# Patient Record
Sex: Male | Born: 1990 | Race: Black or African American | Hispanic: No | Marital: Single | Smoking: Former smoker
Health system: Southern US, Community
[De-identification: ages and names within clinical notes are randomized; demographics above are authoritative.]

## PROBLEM LIST (undated history)

## (undated) DIAGNOSIS — A549 Gonococcal infection, unspecified: Secondary | ICD-10-CM

## (undated) DIAGNOSIS — J45909 Unspecified asthma, uncomplicated: Secondary | ICD-10-CM

## (undated) HISTORY — DX: Gonococcal infection, unspecified: A54.9

---
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
## 2010-12-10 LAB — POCT INFECTIOUS MONO SCREEN: Mono Screen: POSITIVE — AB

## 2011-08-15 ENCOUNTER — Ambulatory Visit (INDEPENDENT_AMBULATORY_CARE_PROVIDER_SITE_OTHER): Payer: Self-pay

## 2011-08-15 DIAGNOSIS — B20 Human immunodeficiency virus [HIV] disease: Secondary | ICD-10-CM

## 2011-08-15 DIAGNOSIS — Z23 Encounter for immunization: Secondary | ICD-10-CM

## 2011-08-15 LAB — URINALYSIS
Bilirubin Urine: NEGATIVE
Hgb urine dipstick: NEGATIVE
Ketones, ur: NEGATIVE mg/dL
Nitrite: NEGATIVE
Protein, ur: NEGATIVE mg/dL
Specific Gravity, Urine: 1.027 (ref 1.005–1.030)
Urobilinogen, UA: 1 mg/dL (ref 0.0–1.0)

## 2011-08-15 LAB — CBC WITH DIFFERENTIAL/PLATELET
Basophils Absolute: 0 10*3/uL (ref 0.0–0.1)
Basophils Relative: 0 % (ref 0–1)
Eosinophils Relative: 2 % (ref 0–5)
Lymphocytes Relative: 46 % (ref 12–46)
MCHC: 33.3 g/dL (ref 30.0–36.0)
MCV: 94.1 fL (ref 78.0–100.0)
Neutro Abs: 1.5 10*3/uL — ABNORMAL LOW (ref 1.7–7.7)
Platelets: 222 10*3/uL (ref 150–400)
RDW: 13.4 % (ref 11.5–15.5)
WBC: 3.8 10*3/uL — ABNORMAL LOW (ref 4.0–10.5)

## 2011-08-15 LAB — LIPID PANEL
LDL Cholesterol: 109 mg/dL — ABNORMAL HIGH (ref 0–99)
VLDL: 10 mg/dL (ref 0–40)

## 2011-08-15 LAB — HEPATITIS B CORE ANTIBODY, TOTAL: Hep B Core Total Ab: NEGATIVE

## 2011-08-15 LAB — RPR

## 2011-08-15 LAB — HEPATITIS A ANTIBODY, TOTAL: Hep A Total Ab: NEGATIVE

## 2011-08-15 LAB — COMPLETE METABOLIC PANEL WITH GFR
ALT: 9 U/L (ref 0–53)
AST: 21 U/L (ref 0–37)
Alkaline Phosphatase: 45 U/L (ref 39–117)
Calcium: 9.7 mg/dL (ref 8.4–10.5)
Chloride: 101 mEq/L (ref 96–112)
Creat: 0.94 mg/dL (ref 0.50–1.35)

## 2011-08-15 LAB — HEPATITIS C ANTIBODY: HCV Ab: NEGATIVE

## 2011-08-15 LAB — HEPATITIS B SURFACE ANTIGEN: Hepatitis B Surface Ag: NEGATIVE

## 2011-08-15 LAB — HEPATITIS B SURFACE ANTIBODY,QUALITATIVE: Hep B S Ab: NEGATIVE

## 2011-08-17 LAB — TB SKIN TEST
Induration: 0
TB Skin Test: NEGATIVE mm

## 2011-08-19 LAB — HIV-1 RNA ULTRAQUANT REFLEX TO GENTYP+: HIV 1 RNA Quant: 361 copies/mL — ABNORMAL HIGH (ref <20)

## 2011-08-20 DIAGNOSIS — B20 Human immunodeficiency virus [HIV] disease: Secondary | ICD-10-CM | POA: Insufficient documentation

## 2011-08-20 LAB — HIV 1/2 CONFIRMATION
HIV-1 antibody: POSITIVE — AB
HIV-2 Ab: NEGATIVE

## 2011-08-20 NOTE — Progress Notes (Signed)
Patient states he is still in shock from his diagnosis and has not really dealt with the reality of having HIV.   He has tried to avoid coming for treatment due to embarrassment.  I advised him this is normal at this current stage but if it continues he should inform his provider.  Counseling services were offered but he refused.  Pt had very flat affect during the intake but did smile before leaving.

## 2011-09-11 ENCOUNTER — Encounter: Payer: Self-pay | Admitting: *Deleted

## 2011-09-12 ENCOUNTER — Telehealth: Payer: Self-pay | Admitting: *Deleted

## 2011-09-12 ENCOUNTER — Ambulatory Visit: Payer: Self-pay | Admitting: Internal Medicine

## 2011-09-12 NOTE — Telephone Encounter (Signed)
Message left for pt to call for another appt with Dr. Drue Second.

## 2011-09-26 ENCOUNTER — Encounter: Payer: Self-pay | Admitting: Internal Medicine

## 2011-09-26 ENCOUNTER — Ambulatory Visit (INDEPENDENT_AMBULATORY_CARE_PROVIDER_SITE_OTHER): Payer: Self-pay | Admitting: Internal Medicine

## 2011-09-26 VITALS — BP 110/74 | HR 89 | Temp 98.3°F | Ht 73.0 in | Wt 158.0 lb

## 2011-09-26 DIAGNOSIS — A5400 Gonococcal infection of lower genitourinary tract, unspecified: Secondary | ICD-10-CM

## 2011-09-26 DIAGNOSIS — Z21 Asymptomatic human immunodeficiency virus [HIV] infection status: Secondary | ICD-10-CM

## 2011-09-26 DIAGNOSIS — Z23 Encounter for immunization: Secondary | ICD-10-CM

## 2011-09-26 DIAGNOSIS — B20 Human immunodeficiency virus [HIV] disease: Secondary | ICD-10-CM

## 2011-09-26 DIAGNOSIS — A549 Gonococcal infection, unspecified: Secondary | ICD-10-CM

## 2011-09-26 NOTE — Progress Notes (Signed)
HIV CLINIC VISIT  RFV: new patient, establishing care after recent diagnosis Subjective:    Patient ID: Timothy Reynolds, male    DOB: May 06, 1991, 21 y.o.   MRN: [id number]  HPI Timothy Reynolds is a 20yo AAM with HIV disease, diagnosed in Aug 2012, in setting of STI workup. The patient RF for HIV is unprotected sex with HIV infected persons/MSM. He denies any IVDU. He has been still processing his diagnosis over the last few months. He has been reading some information regarding hiv diagnosis, and treatment. He presents to clinic to gain more information. Still not ready to start therapy at this point. He has only disclosed his HIV status to a friend, presumably his roommate. He is currently not sexually active.  During this visit we have discussed hiv transmission, rec for starting treatment, what that would entail, applying for adap to get insurance.  He is currently uninsured but does work 2 part time jobs, working 50-60s hrs per week.  Active Ambulatory Problems    Diagnosis Date Noted  . HIV infection 08/20/2011   Resolved Ambulatory Problems    Diagnosis Date Noted  . No Resolved Ambulatory Problems   No Additional Past Medical History  - GC in 04/2011  No Known Allergies Prior to Admission medications   Not on File  - multivitamin  SH: he moved to the area roughly 2 years ago from [place] area. His mother is in the [place], and has 3 sisters that live in the [place], in other states. He does not smoke and occasionally drinks alcohol socially. No illicit drug use  FH: CAD, malignancies with his maternal grandmother  Review of Systems  Constitutional: Negative for fever, chills, diaphoresis, activity change, appetite change, fatigue and unexpected weight change.  HENT: Negative for congestion, sore throat, rhinorrhea, sneezing, trouble swallowing and sinus pressure.  Eyes: Negative for photophobia and visual disturbance.  Respiratory: Negative for cough, chest tightness, shortness of breath,  wheezing and stridor.  Cardiovascular: Negative for chest pain, palpitations and leg swelling.  Gastrointestinal: Negative for nausea, vomiting, abdominal pain, diarrhea, constipation, blood in stool, abdominal distention and anal bleeding.  Genitourinary: Negative for dysuria, hematuria, flank pain and difficulty urinating.  Musculoskeletal: occassional myalgias from work equipment backpack he carries, but not presentally having any problems, no back pain, joint swelling, arthralgias and gait problem.  Skin: Negative for color change, pallor, rash and wound.  Neurological: Negative for dizziness, tremors, weakness and light-headedness.  Hematological: Negative for adenopathy. Does not bruise/bleed easily.  Psychiatric/Behavioral: Negative for behavioral problems, confusion, sleep disturbance, dysphoric mood, decreased concentration and agitation.       Objective:   Physical Exam BP 110/74  Pulse 89  Temp(Src) 98.3 F (36.8 C) (Oral)  Ht 6\' 1"  (1.854 m)  Wt 158 lb (71.668 kg)  BMI 20.85 kg/m2  Constitutional: He is oriented to person, place, and time. He appears well-developed and well-nourished. No distress.  HENT:  Mouth/Throat: Oropharynx is clear and moist. No oropharyngeal exudate.  Cardiovascular: Normal rate, regular rhythm and normal heart sounds. Exam reveals no gallop and no friction rub.  No murmur heard.  Pulmonary/Chest: Effort normal and breath sounds normal. No respiratory distress. He has no wheezes.  Abdominal: Soft. Bowel sounds are normal. He exhibits no distension. There is no tenderness.  Lymphadenopathy:  He has no cervical adenopathy.  Neurological: He is alert and oriented to person, place, and time.  Skin: Skin is warm and dry. No rash noted. No erythema.  Psychiatric: He has a normal  mood and affect. His behavior is normal.      Assessment & Plan:  21 yo Male with HIV disease, CD 4 count of 600(37%)/VL 361, ART naive  1) HIV = will continue to monitor  CD4 count and VL on a quarterly basis. At this point patient does not need to start ART based upon his CD 4 count. He still is processing his HIV diagnosis. Have offered counselors to help discuss disclosure to his family and friends.  2) access to care =will have patient apply to adap so that he will not have to pay for visits and lab works that pertain to HIV. Have asked the patient to make appt with Britta Mccreedy, and bring in his lab bill that he has already received from his prior visit.  3) health maintenance = patient received H B series as an adoloescent, but does not appear to be immune. Have given him a booster of 2 doses of HBV, will check at his next visit if HBsAb response.  4) hiv prevention = will need to use condoms to prevent transmission onto others  rtc in 3 months.

## 2011-10-09 ENCOUNTER — Ambulatory Visit: Payer: Self-pay

## 2011-10-17 ENCOUNTER — Ambulatory Visit: Payer: Self-pay

## 2011-11-18 ENCOUNTER — Other Ambulatory Visit: Payer: Self-pay | Admitting: Licensed Clinical Social Worker

## 2011-11-18 DIAGNOSIS — B20 Human immunodeficiency virus [HIV] disease: Secondary | ICD-10-CM

## 2011-12-12 ENCOUNTER — Other Ambulatory Visit: Payer: Self-pay

## 2011-12-26 ENCOUNTER — Ambulatory Visit: Payer: Self-pay | Admitting: Internal Medicine

## 2012-04-28 ENCOUNTER — Other Ambulatory Visit: Payer: Self-pay

## 2012-05-12 ENCOUNTER — Ambulatory Visit: Payer: Self-pay | Admitting: Internal Medicine

## 2012-06-10 ENCOUNTER — Ambulatory Visit: Payer: Self-pay | Admitting: Infectious Disease

## 2012-06-10 ENCOUNTER — Ambulatory Visit (INDEPENDENT_AMBULATORY_CARE_PROVIDER_SITE_OTHER): Payer: Self-pay | Admitting: Infectious Disease

## 2012-06-10 VITALS — BP 143/89 | HR 86 | Temp 97.8°F | Ht 72.0 in | Wt 170.0 lb

## 2012-06-10 DIAGNOSIS — R369 Urethral discharge, unspecified: Secondary | ICD-10-CM

## 2012-06-10 DIAGNOSIS — A5400 Gonococcal infection of lower genitourinary tract, unspecified: Secondary | ICD-10-CM

## 2012-06-10 DIAGNOSIS — A549 Gonococcal infection, unspecified: Secondary | ICD-10-CM

## 2012-06-10 DIAGNOSIS — Z23 Encounter for immunization: Secondary | ICD-10-CM

## 2012-06-10 DIAGNOSIS — B20 Human immunodeficiency virus [HIV] disease: Secondary | ICD-10-CM

## 2012-06-10 LAB — CBC WITH DIFFERENTIAL/PLATELET
Basophils Absolute: 0 10*3/uL (ref 0.0–0.1)
Basophils Relative: 0 % (ref 0–1)
Eosinophils Absolute: 0.1 10*3/uL (ref 0.0–0.7)
Eosinophils Relative: 1 % (ref 0–5)
MCH: 31.4 pg (ref 26.0–34.0)
MCV: 93.2 fL (ref 78.0–100.0)
Neutrophils Relative %: 70 % (ref 43–77)
Platelets: 258 10*3/uL (ref 150–400)
RBC: 4.72 MIL/uL (ref 4.22–5.81)
RDW: 12.8 % (ref 11.5–15.5)

## 2012-06-10 LAB — COMPLETE METABOLIC PANEL WITH GFR
ALT: 12 U/L (ref 0–53)
AST: 19 U/L (ref 0–37)
Albumin: 4.6 g/dL (ref 3.5–5.2)
BUN: 10 mg/dL (ref 6–23)
CO2: 29 mEq/L (ref 19–32)
Calcium: 9.6 mg/dL (ref 8.4–10.5)
Chloride: 102 mEq/L (ref 96–112)
Creat: 0.93 mg/dL (ref 0.50–1.35)
GFR, Est African American: >89 mL/min
Potassium: 3.6 mEq/L (ref 3.5–5.3)

## 2012-06-10 LAB — LIPID PANEL
Cholesterol: 189 mg/dL (ref 0–200)
LDL Cholesterol: 119 mg/dL — ABNORMAL HIGH (ref 0–99)
Triglycerides: 41 mg/dL (ref <150)
VLDL: 8 mg/dL (ref 0–40)

## 2012-06-10 MED ORDER — CEFTRIAXONE SODIUM 1 G IJ SOLR
250.0000 mg | Freq: Once | INTRAMUSCULAR | Status: AC
  Administered 2012-06-10: 250 mg via INTRAMUSCULAR

## 2012-06-10 MED ORDER — AZITHROMYCIN 250 MG PO TABS
1000.0000 mg | ORAL_TABLET | Freq: Once | ORAL | Status: AC
  Administered 2012-06-10: 1000 mg via ORAL

## 2012-06-10 NOTE — Progress Notes (Signed)
Subjective:    Patient ID: Timothy Reynolds, male    DOB: March 17, 1991, 21 y.o.   MRN: [id number]  HPI  21 year old Afro-American male diagnosed with HIV when being worked up for sexual transmitted diseases namely gonorrheal infection. As far load actually was in a few 100 copies per milliliter when checked in December of 2012. Since then he has not been seen in our clinic as was planned. He is not on antiretroviral medicines. He is currently unemployed he brought in paperwork to work for [employer] but is not yet enrolled in the AIDS drug assistance program. He presents to clinic acutely today with complaints of purulent discharge from his penis. He admits to unprotected sexual intercourse with a new partner since been diagnosed with HIV largely insert of intercourse.  Review of Systems  Constitutional: Negative for fever, chills, diaphoresis, activity change, appetite change, fatigue and unexpected weight change.  HENT: Negative for congestion, sore throat, rhinorrhea, sneezing, trouble swallowing and sinus pressure.   Eyes: Negative for photophobia and visual disturbance.  Respiratory: Negative for cough, chest tightness, shortness of breath, wheezing and stridor.   Cardiovascular: Negative for chest pain, palpitations and leg swelling.  Gastrointestinal: Negative for nausea, vomiting, abdominal pain, diarrhea, constipation, blood in stool, abdominal distention and anal bleeding.  Genitourinary: Negative for dysuria, hematuria, flank pain and difficulty urinating.  Musculoskeletal: Negative for myalgias, back pain, joint swelling, arthralgias and gait problem.  Skin: Negative for color change, pallor, rash and wound.  Neurological: Negative for dizziness, tremors, weakness and light-headedness.  Hematological: Negative for adenopathy. Does not bruise/bleed easily.  Psychiatric/Behavioral: Negative for behavioral problems, confusion, disturbed wake/sleep cycle, dysphoric mood, decreased  concentration and agitation.       Objective:   Physical Exam  Constitutional: He is oriented to person, place, and time. He appears well-developed and well-nourished. No distress.  HENT:  Head: Normocephalic and atraumatic.  Mouth/Throat: Oropharynx is clear and moist. No oropharyngeal exudate.  Eyes: Conjunctivae normal and EOM are normal. Pupils are equal, round, and reactive to light. No scleral icterus.  Neck: Normal range of motion. Neck supple. No JVD present.  Cardiovascular: Normal rate, regular rhythm and normal heart sounds.  Exam reveals no gallop and no friction rub.   No murmur heard. Pulmonary/Chest: Effort normal and breath sounds normal. No respiratory distress. He has no wheezes. He has no rales. He exhibits no tenderness.  Abdominal: He exhibits no distension and no mass. There is no tenderness. There is no rebound and no guarding.  Genitourinary:    Penile tenderness present. Discharge found.  Musculoskeletal: He exhibits no edema and no tenderness.  Lymphadenopathy:    He has no cervical adenopathy.  Neurological: He is alert and oriented to person, place, and time. He has normal reflexes. He exhibits normal muscle tone. Coordination normal.  Skin: Skin is warm and dry. He is not diaphoretic. No erythema. No pallor.  Psychiatric: He has a normal mood and affect. His behavior is normal. Judgment and thought content normal.          Assessment & Plan:  Penile discharge Undoubtedly gonorrhea again. We're checking it GC and Chlamydia and urine as he did not want to have the urethral probe performed. He was given Rocephin and azithromycin in clinic. Have asked him to please use condoms in the future and make sure that all partners of been tested and treated for both gonorrhea Chlamydia and HIV of course. Over a syphilis test as well in am.  HIV infection Recheck  viral load and CD4 count. I expect his viral load be high now Will genotype it and come up with an  antiviral regimen for him.

## 2012-06-10 NOTE — Assessment & Plan Note (Signed)
Undoubtedly gonorrhea again. We're checking it GC and Chlamydia and urine as he did not want to have the urethral probe performed. He was given Rocephin and azithromycin in clinic. Have asked him to please use condoms in the future and make sure that all partners of been tested and treated for both gonorrhea Chlamydia and HIV of course. Over a syphilis test as well in am.

## 2012-06-10 NOTE — Assessment & Plan Note (Signed)
Recheck viral load and CD4 count. I expect his viral load be high now Will genotype it and come up with an antiviral regimen for him.

## 2012-06-10 NOTE — Patient Instructions (Signed)
PLEASE COME BACK TO CLINIC IN 3 WEEKS TIME TO DISCUSS A REGIMEN  PLEASE USE CONDOMS AND MAKE SURE YOUR PARTNERS ARE TESTED AND TREATED

## 2012-06-11 LAB — T-HELPER CELL (CD4) - (RCID CLINIC ONLY): CD4 T Cell Abs: 330 uL — ABNORMAL LOW (ref 400–2700)

## 2012-06-11 LAB — HIV-1 RNA ULTRAQUANT REFLEX TO GENTYP+: HIV 1 RNA Quant: 705 copies/mL — ABNORMAL HIGH (ref <20)

## 2012-07-01 ENCOUNTER — Ambulatory Visit: Payer: Self-pay

## 2012-07-01 ENCOUNTER — Ambulatory Visit (INDEPENDENT_AMBULATORY_CARE_PROVIDER_SITE_OTHER): Payer: Self-pay | Admitting: Infectious Disease

## 2012-07-01 ENCOUNTER — Encounter: Payer: Self-pay | Admitting: Infectious Disease

## 2012-07-01 VITALS — BP 145/76 | HR 93 | Temp 98.2°F | Ht 72.0 in | Wt 170.0 lb

## 2012-07-01 DIAGNOSIS — B20 Human immunodeficiency virus [HIV] disease: Secondary | ICD-10-CM

## 2012-07-01 DIAGNOSIS — A549 Gonococcal infection, unspecified: Secondary | ICD-10-CM

## 2012-07-01 DIAGNOSIS — A5400 Gonococcal infection of lower genitourinary tract, unspecified: Secondary | ICD-10-CM

## 2012-07-01 MED ORDER — ELVITEG-COBIC-EMTRICIT-TENOFDF 150-150-200-300 MG PO TABS: 1.0000 | ORAL_TABLET | Freq: Every day | ORAL | Status: DC

## 2012-07-01 NOTE — Progress Notes (Signed)
Subjective:    Patient ID: Timothy Reynolds, male    DOB: 1990-10-05, 21 y.o.   MRN: [id number]  HPI  21 year old [place] American male with HIV diagnosed in August of 2012, never on antiviral therapy with low-level viremia with a few 100 copies last December and now 700 copies per milliliters blood. CD4 count has ranged in the 600s to mid 300s. I saw him earlier this month and treated him for gonococcal infection.  I discussed need for using condoms to prevent transmission of HIV and other sexually transmitted diseases including the patient's gonorrhea.  We discussed some possibilities for antiretroviral medicines for the patient and I had recommended starting complera vs stribild vs tivicay and truvada. I discussed the fact that the patient does have some degree of immunological control over his virus at this point in time. The patient preferred this point time to delay therapy. We'll check viral load and genotype today and CD4 count and bring him back in a few months time. IF he remains with low level viremia he may potentially qualify for an ACTG trial though that would require 2 years of low level viremia<500. I spent greater than 45 minutes with the patient including greater than 50% of time in face to face counsel of the patient and in coordination of their care.   Review of Systems  Constitutional: Negative for fever, chills, diaphoresis, activity change, appetite change, fatigue and unexpected weight change.  HENT: Negative for congestion, sore throat, rhinorrhea, sneezing, trouble swallowing and sinus pressure.   Eyes: Negative for photophobia and visual disturbance.  Respiratory: Negative for cough, chest tightness, shortness of breath, wheezing and stridor.   Cardiovascular: Negative for chest pain, palpitations and leg swelling.  Gastrointestinal: Negative for nausea, vomiting, abdominal pain, diarrhea, constipation, blood in stool, abdominal distention and anal bleeding.    Genitourinary: Negative for dysuria, hematuria, flank pain and difficulty urinating.  Musculoskeletal: Negative for myalgias, back pain, joint swelling, arthralgias and gait problem.  Skin: Negative for color change, pallor, rash and wound.  Neurological: Negative for dizziness, tremors, weakness and light-headedness.  Hematological: Negative for adenopathy. Does not bruise/bleed easily.  Psychiatric/Behavioral: Negative for behavioral problems, confusion, disturbed wake/sleep cycle, dysphoric mood, decreased concentration and agitation.       Objective:   Physical Exam  Constitutional: He is oriented to person, place, and time. He appears well-developed and well-nourished. No distress.  HENT:  Head: Normocephalic and atraumatic.  Mouth/Throat: Oropharynx is clear and moist. No oropharyngeal exudate.  Eyes: Conjunctivae normal and EOM are normal. Pupils are equal, round, and reactive to light. No scleral icterus.  Neck: Normal range of motion. Neck supple. No JVD present.  Cardiovascular: Normal rate, regular rhythm and normal heart sounds.  Exam reveals no gallop and no friction rub.   No murmur heard. Pulmonary/Chest: Effort normal and breath sounds normal. No respiratory distress. He has no wheezes. He has no rales. He exhibits no tenderness.  Abdominal: He exhibits no distension and no mass. There is no tenderness. There is no rebound and no guarding.  Musculoskeletal: He exhibits no edema and no tenderness.  Lymphadenopathy:    He has no cervical adenopathy.  Neurological: He is alert and oriented to person, place, and time. He exhibits normal muscle tone. Coordination normal.  Skin: Skin is warm and dry. He is not diaphoretic. No erythema. No pallor.  Psychiatric: He has a normal mood and affect. His behavior is normal. Judgment and thought content normal.  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 It is not unreasonable to delay therapy for now. My pain is that he would likely benefit from antiretroviral therapy the patient has not yet right rate he began and this is not unreasonable idea either. We will check viral load genotype today and see in a few months time.  Gonococcal urethritis: Status post Rocephin and azithromycin. We'll check his urine in a few months time to make sure he has not had recurrence through his sexual network.

## 2012-09-16 ENCOUNTER — Other Ambulatory Visit: Payer: Self-pay

## 2012-09-16 DIAGNOSIS — B20 Human immunodeficiency virus [HIV] disease: Secondary | ICD-10-CM

## 2012-09-16 LAB — CBC WITH DIFFERENTIAL/PLATELET
Basophils Absolute: 0 K/uL (ref 0.0–0.1)
Basophils Relative: 1 % (ref 0–1)
Eosinophils Absolute: 0.1 K/uL (ref 0.0–0.7)
Eosinophils Relative: 2 % (ref 0–5)
HCT: 43.2 % (ref 39.0–52.0)
Hemoglobin: 14.6 g/dL (ref 13.0–17.0)
Lymphocytes Relative: 39 % (ref 12–46)
Lymphs Abs: 1.5 K/uL (ref 0.7–4.0)
MCH: 31.6 pg (ref 26.0–34.0)
MCHC: 33.8 g/dL (ref 30.0–36.0)
MCV: 93.5 fL (ref 78.0–100.0)
Monocytes Absolute: 0.5 K/uL (ref 0.1–1.0)
Monocytes Relative: 14 % — ABNORMAL HIGH (ref 3–12)
Neutro Abs: 1.8 K/uL (ref 1.7–7.7)
Neutrophils Relative %: 44 % (ref 43–77)
Platelets: 242 K/uL (ref 150–400)
RBC: 4.62 MIL/uL (ref 4.22–5.81)
RDW: 14.2 % (ref 11.5–15.5)
WBC: 3.9 K/uL — ABNORMAL LOW (ref 4.0–10.5)

## 2012-09-16 LAB — COMPLETE METABOLIC PANEL WITHOUT GFR
ALT: 11 U/L (ref 0–53)
AST: 18 U/L (ref 0–37)
Albumin: 4.7 g/dL (ref 3.5–5.2)
Alkaline Phosphatase: 49 U/L (ref 39–117)
BUN: 15 mg/dL (ref 6–23)
CO2: 25 meq/L (ref 19–32)
Calcium: 9.7 mg/dL (ref 8.4–10.5)
Chloride: 104 meq/L (ref 96–112)
Creat: 0.95 mg/dL (ref 0.50–1.35)
GFR, Est African American: >89 mL/min
GFR, Est Non African American: >89 mL/min
Glucose, Bld: 80 mg/dL (ref 70–99)
Potassium: 3.8 meq/L (ref 3.5–5.3)
Sodium: 140 meq/L (ref 135–145)
Total Bilirubin: 0.8 mg/dL (ref 0.3–1.2)
Total Protein: 7.9 g/dL (ref 6.0–8.3)

## 2012-09-16 LAB — SYPHILIS: RPR W/REFLEX TO RPR TITER AND TREPONEMAL ANTIBODIES, TRADITIONAL SCREENING AND DIAGNOSIS ALGORITHM

## 2012-09-17 LAB — T-HELPER CELL (CD4) - (RCID CLINIC ONLY): CD4 T Cell Abs: 560 uL (ref 400–2700)

## 2012-09-17 LAB — HIV-1 RNA QUANT-NO REFLEX-BLD: HIV-1 RNA Quant, Log: 2.58 {Log} — ABNORMAL HIGH (ref <1.30)

## 2012-09-30 ENCOUNTER — Ambulatory Visit: Payer: Self-pay | Admitting: Infectious Disease

## 2017-03-22 ENCOUNTER — Encounter (HOSPITAL_COMMUNITY): Payer: Self-pay | Admitting: Emergency Medicine

## 2017-03-22 DIAGNOSIS — H10022 Other mucopurulent conjunctivitis, left eye: Secondary | ICD-10-CM | POA: Insufficient documentation

## 2017-03-22 DIAGNOSIS — B20 Human immunodeficiency virus [HIV] disease: Secondary | ICD-10-CM | POA: Insufficient documentation

## 2017-03-22 DIAGNOSIS — N390 Urinary tract infection, site not specified: Secondary | ICD-10-CM | POA: Insufficient documentation

## 2017-03-22 DIAGNOSIS — Z87891 Personal history of nicotine dependence: Secondary | ICD-10-CM | POA: Insufficient documentation

## 2017-03-22 NOTE — ED Triage Notes (Signed)
Pt reports having discharge from penis for the last several days. And discharge is clear in color.

## 2017-03-23 ENCOUNTER — Encounter (HOSPITAL_COMMUNITY): Payer: Self-pay | Admitting: Emergency Medicine

## 2017-03-23 ENCOUNTER — Emergency Department (HOSPITAL_COMMUNITY)
Admission: EM | Admit: 2017-03-23 | Discharge: 2017-03-24 | Disposition: A | Discharge status: Home / Self Care | Payer: PRIVATE HEALTH INSURANCE | Attending: Emergency Medicine | Admitting: Emergency Medicine

## 2017-03-23 ENCOUNTER — Emergency Department (HOSPITAL_COMMUNITY)
Admission: EM | Admit: 2017-03-23 | Discharge: 2017-03-23 | Disposition: A | Discharge status: Home / Self Care | Payer: Self-pay | Attending: Emergency Medicine | Admitting: Emergency Medicine

## 2017-03-23 ENCOUNTER — Emergency Department (HOSPITAL_COMMUNITY): Payer: PRIVATE HEALTH INSURANCE

## 2017-03-23 DIAGNOSIS — B20 Human immunodeficiency virus [HIV] disease: Secondary | ICD-10-CM | POA: Insufficient documentation

## 2017-03-23 DIAGNOSIS — R8281 Pyuria: Secondary | ICD-10-CM

## 2017-03-23 DIAGNOSIS — M13161 Monoarthritis, not elsewhere classified, right knee: Secondary | ICD-10-CM | POA: Insufficient documentation

## 2017-03-23 DIAGNOSIS — H109 Unspecified conjunctivitis: Secondary | ICD-10-CM

## 2017-03-23 DIAGNOSIS — Z87891 Personal history of nicotine dependence: Secondary | ICD-10-CM | POA: Insufficient documentation

## 2017-03-23 HISTORY — DX: Unspecified asthma, uncomplicated: J45.909

## 2017-03-23 LAB — URINALYSIS, ROUTINE W REFLEX MICROSCOPIC
Bacteria, UA: NONE SEEN
Bilirubin Urine: NEGATIVE
Glucose, UA: NEGATIVE mg/dL
HGB URINE DIPSTICK: NEGATIVE
Ketones, ur: NEGATIVE mg/dL
NITRITE: NEGATIVE
Protein, ur: NEGATIVE mg/dL
SPECIFIC GRAVITY, URINE: 1.017 (ref 1.005–1.030)
Squamous Epithelial / LPF: NONE SEEN
pH: 6 (ref 5.0–8.0)

## 2017-03-23 MED ORDER — AZITHROMYCIN 250 MG PO TABS
1000.0000 mg | ORAL_TABLET | Freq: Once | ORAL | Status: AC
  Administered 2017-03-23: 1000 mg via ORAL
  Filled 2017-03-23: qty 4

## 2017-03-23 MED ORDER — TOBRAMYCIN 0.3 % OP SOLN
1.0000 [drp] | OPHTHALMIC | Status: DC
  Administered 2017-03-23: 1 [drp] via OPHTHALMIC
  Filled 2017-03-23: qty 5

## 2017-03-23 MED ORDER — STERILE WATER FOR INJECTION IJ SOLN
INTRAMUSCULAR | Status: AC
  Administered 2017-03-23: 10 mL
  Filled 2017-03-23: qty 10

## 2017-03-23 MED ORDER — CEFTRIAXONE SODIUM 250 MG IJ SOLR
250.0000 mg | Freq: Once | INTRAMUSCULAR | Status: AC
  Administered 2017-03-23: 250 mg via INTRAMUSCULAR
  Filled 2017-03-23: qty 250

## 2017-03-23 NOTE — ED Notes (Signed)
Bed: WA08 Expected date:  Expected time:  Means of arrival:  Comments: 

## 2017-03-23 NOTE — Discharge Instructions (Signed)
Your urine today was concerning for possible STD exposure. You have been treated for gonorrhea and chlamydia today. Follow-up with the health department in 48 hours for the results of your STD tests. If you test positive for STDs, notify all sexual partners of their need to be tested and treated as well. Do not engage in sexual intercourse for one week. Use a condom when sexually active.  For your eye redness, you have been given tobramycin drops. Apply one drop in the left eye every 4 hours for the next week. If your symptoms worsen, we recommend follow-up with an ophthalmologist. Emergency emergency department for new or concerning symptoms.

## 2017-03-23 NOTE — ED Triage Notes (Signed)
Pt from home with c/o right knee pain x 4 days. Pt is ambulatory at time of assessment without difficulty.

## 2017-03-23 NOTE — ED Notes (Signed)
Bed: WA05 Expected date:  Expected time:  Means of arrival:  Comments: 

## 2017-03-23 NOTE — ED Notes (Signed)
Patient refused HIV antibody testing but allowed for the RPR testing. RN aware.

## 2017-03-23 NOTE — ED Provider Notes (Signed)
[facility]-EMERGENCY DEPT Provider Note   CSN: [id number] Arrival date & time: 03/22/17  2350     History   Chief Complaint Chief Complaint  Patient presents with  . Exposure to STD    HPI Timothy Reynolds is a 26 y.o. male.  26 year old male with a history of HIV presents to the emergency department for penile discharge. He reports onset of discharge 3 days ago. He describes the discharge as clear. He has not had any associated dysuria, abdominal pain, nausea, vomiting, fevers. He further denies genital lesions, testicular tenderness, and scrotal swelling. Onset of symptoms were after an episode of oral sex with a new partner. This was unprotected. Patient has been sexually active with 2 partners in the last 6 months. He is sexually active with both men and women.  Patient also complaining of redness to his left eye. He notes that he "swat at a fly" 2 days ago. He has noticed progressive redness since this time. He describes some irritation to his eyes without vision changes. He has had some drainage from his eye, mostly present in the morning. He has been washing the eye out with water. No other treatments prior to arrival.      Past Medical History:  Diagnosis Date  . Gonorrhea     Patient Active Problem List   Diagnosis Date Noted  . Penile discharge 06/10/2012  . HIV infection 08/20/2011    History reviewed. No pertinent surgical history.     Home Medications    Prior to Admission medications   Not on File    Family History History reviewed. No pertinent family history.  Social History Social History  Substance Use Topics  . Smoking status: Former Smoker    Packs/day: 0.30    Types: Cigars  . Smokeless tobacco: Never Used  . Alcohol use 3.5 oz/week    7 drink(s) per week     Comment: social      Allergies   Patient has no known allergies.   Review of Systems Review of Systems Ten systems reviewed and are negative for acute change, except as noted in  the HPI.    Physical Exam Updated Vital Signs BP (!) 166/108 (BP Location: Right Arm)   Pulse 97   Temp 98.8 F (37.1 C) (Oral)   Resp 18   Ht 6\' 1"  (1.854 m)   Wt 81.6 kg (180 lb)   SpO2 100%   BMI 23.75 kg/m   Physical Exam  Constitutional: He is oriented to person, place, and time. He appears well-developed and well-nourished. No distress.  Nontoxic appearing and in NAD  HENT:  Head: Normocephalic and atraumatic.  Eyes: Pupils are equal, round, and reactive to light. EOM are normal. Left conjunctiva is injected. No scleral icterus.  Normal EOMs bilaterally. L conjunctival injection. No drainage or purulence. No blepharitis.  Neck: Normal range of motion.  Pulmonary/Chest: Effort normal. No respiratory distress.  Respirations even and unlabored  Genitourinary: Right testis shows no mass, no swelling and no tenderness. Right testis is descended. Left testis shows no mass, no swelling and no tenderness. Left testis is descended. Circumcised. No penile erythema.  Genitourinary Comments: Chaperoned exam with mild left inguinal lymphadenopathy. No penile discharge. No genital lesions. Exam chaperoned by Lelon MastSamantha, NT.  Musculoskeletal: Normal range of motion.  Lymphadenopathy: Inguinal adenopathy noted on the left side.  Neurological: He is alert and oriented to person, place, and time. He exhibits normal muscle tone. Coordination normal.  GCS 15. Patient moving all  extremities.  Skin: Skin is warm and dry. No rash noted. He is not diaphoretic. No erythema. No pallor.  Psychiatric: He has a normal mood and affect. His behavior is normal.  Nursing note and vitals reviewed.    ED Treatments / Results  Labs (all labs ordered are listed, but only abnormal results are displayed) Labs Reviewed  URINALYSIS, ROUTINE W REFLEX MICROSCOPIC - Abnormal; Notable for the following:       Result Value   Leukocytes, UA TRACE (*)    All other components within normal limits  RPR    GC/CHLAMYDIA PROBE AMP ([place]) NOT AT [facility]    EKG  EKG Interpretation None       Radiology No results found.  Procedures Procedures (including critical care time)  Medications Ordered in ED Medications  tobramycin (TOBREX) 0.3 % ophthalmic solution 1 drop (not administered)  cefTRIAXone (ROCEPHIN) injection 250 mg (not administered)  azithromycin (ZITHROMAX) tablet 1,000 mg (not administered)     Initial Impression / Assessment and Plan / ED Course  I have reviewed the triage vital signs and the nursing notes.  Pertinent labs & imaging results that were available during my care of the patient were reviewed by me and considered in my medical decision making (see chart for details).     Patient to be discharged with instructions to follow up with the health department. Discussed importance of using protection when sexually active. Patient understands that they have GC/Chlamydia cultures pending and that they will need to inform all sexual partners if results return positive. Patient has been treated prophylacticly with azithromycin and rocephin due to pts history and UA with increased WBCs. Antibiotic drops also given for conjunctivitis. Return precautions discussed and provided. Patient discharged in stable condition with no unaddressed concerns.   Final Clinical Impressions(s) / ED Diagnoses   Final diagnoses:  Pyuria  Conjunctivitis of left eye, unspecified conjunctivitis type    New Prescriptions New Prescriptions   No medications on file     Antony Madura, PA-C 03/23/17 0111    Melene Plan, DO 03/23/17 [id number]

## 2017-03-24 LAB — CBC WITH DIFFERENTIAL/PLATELET
Basophils Absolute: 0 10*3/uL (ref 0.0–0.1)
Basophils Relative: 0 %
EOS ABS: 0.1 10*3/uL (ref 0.0–0.7)
Eosinophils Relative: 1 %
HCT: 38.3 % — ABNORMAL LOW (ref 39.0–52.0)
HEMOGLOBIN: 12.8 g/dL — AB (ref 13.0–17.0)
LYMPHS ABS: 2.3 10*3/uL (ref 0.7–4.0)
LYMPHS PCT: 26 %
MCH: 31 pg (ref 26.0–34.0)
MCHC: 33.4 g/dL (ref 30.0–36.0)
MCV: 92.7 fL (ref 78.0–100.0)
Monocytes Absolute: 1 10*3/uL (ref 0.1–1.0)
Monocytes Relative: 12 %
NEUTROS PCT: 61 %
Neutro Abs: 5.5 10*3/uL (ref 1.7–7.7)
Platelets: 332 10*3/uL (ref 150–400)
RBC: 4.13 MIL/uL — ABNORMAL LOW (ref 4.22–5.81)
RDW: 14 % (ref 11.5–15.5)
WBC: 8.8 10*3/uL (ref 4.0–10.5)

## 2017-03-24 LAB — BASIC METABOLIC PANEL
ANION GAP: 9 (ref 5–15)
BUN: 9 mg/dL (ref 6–20)
CHLORIDE: 101 mmol/L (ref 101–111)
CO2: 27 mmol/L (ref 22–32)
Calcium: 9.4 mg/dL (ref 8.9–10.3)
Creatinine, Ser: 1 mg/dL (ref 0.61–1.24)
GFR calc non Af Amer: >60 mL/min (ref >60)
Glucose, Bld: 88 mg/dL (ref 65–99)
POTASSIUM: 3.8 mmol/L (ref 3.5–5.1)
SODIUM: 137 mmol/L (ref 135–145)

## 2017-03-24 LAB — SYNOVIAL CELL COUNT + DIFF, W/ CRYSTALS
CRYSTALS FLUID: NONE SEEN
Lymphocytes-Synovial Fld: 5 % (ref 0–20)
MONOCYTE-MACROPHAGE-SYNOVIAL FLUID: 15 % — AB (ref 50–90)
Neutrophil, Synovial: 80 % — ABNORMAL HIGH (ref 0–25)
WBC, Synovial: 610 /mm3 — ABNORMAL HIGH (ref 0–200)

## 2017-03-24 LAB — GC/CHLAMYDIA PROBE AMP (~~LOC~~) NOT AT ARMC
Chlamydia: NEGATIVE
Neisseria Gonorrhea: NEGATIVE

## 2017-03-24 MED ORDER — LIDOCAINE HCL (PF) 1 % IJ SOLN: 5.0000 mL | Freq: Once | INTRAMUSCULAR | Status: DC

## 2017-03-24 MED ORDER — NAPROXEN 500 MG PO TABS: 500.0000 mg | ORAL_TABLET | Freq: Two times a day (BID) | ORAL | 0 refills | Status: AC

## 2017-03-24 MED ORDER — LIDOCAINE-EPINEPHRINE (PF) 2 %-1:200000 IJ SOLN
30.0000 mL | Freq: Once | INTRAMUSCULAR | Status: AC
  Administered 2017-03-24: 30 mL via INTRADERMAL
  Filled 2017-03-24: qty 40

## 2017-03-24 NOTE — ED Provider Notes (Signed)
[facility]-EMERGENCY DEPT Provider Note   CSN: [id number] Arrival date & time: 03/23/17  2152     History   Chief Complaint Chief Complaint  Patient presents with  . Knee Pain    right    HPI Timothy Reynolds is a 26 y.o. male.  Timothy Reynolds is a 27 y.o. Male who presents to the emergency department complaining of right knee and swelling for the past 4 days. Patient is HIV positive and is not on antiretroviral therapy. He has never been on treatment before. He has seen ID Dr. Daiva Eves in the past. He denies any injury or trauma to his knee. He just reports swelling and pain with range of motion at his right knee. No other complaints. No treatments prior to arrival. He denies fevers, rashes, knee injury, chest pain, coughing, shortness of breath, abdominal pain, vomiting, diarrhea or rashes.   The history is provided by the patient and medical records. No language interpreter was used.  Knee Pain   Pertinent negatives include no numbness.    Past Medical History:  Diagnosis Date  . Asthma   . Gonorrhea     Patient Active Problem List   Diagnosis Date Noted  . Penile discharge 06/10/2012  . HIV infection 08/20/2011    History reviewed. No pertinent surgical history.     Home Medications    Prior to Admission medications   Medication Sig Start Date End Date Taking? Authorizing Provider  naproxen (NAPROSYN) 500 MG tablet Take 1 tablet (500 mg total) by mouth 2 (two) times daily with a meal. 03/24/17   Everlene Farrier, PA-C    Family History No family history on file.  Social History Social History  Substance Use Topics  . Smoking status: Former Smoker    Packs/day: 0.30    Types: Cigars  . Smokeless tobacco: Never Used  . Alcohol use 3.5 oz/week    7 Standard drinks or equivalent per week     Comment: social      Allergies   Patient has no known allergies.   Review of Systems Review of Systems  Constitutional: Negative for chills and fever.  HENT:  Negative for sore throat.   Eyes: Negative for visual disturbance.  Respiratory: Negative for cough and shortness of breath.   Cardiovascular: Negative for chest pain.  Gastrointestinal: Negative for abdominal pain, nausea and vomiting.  Genitourinary: Negative for dysuria.  Musculoskeletal: Positive for arthralgias and joint swelling. Negative for back pain and neck pain.  Skin: Negative for color change, rash and wound.  Neurological: Negative for weakness, light-headedness and numbness.     Physical Exam Updated Vital Signs BP 124/62 (BP Location: Right Arm)   Pulse (!) 103   Temp 99.9 F (37.7 C) (Oral)   Resp 16   SpO2 100%   Physical Exam  Constitutional: He appears well-developed and well-nourished. No distress.  Nontoxic appearing.  HENT:  Head: Normocephalic and atraumatic.  Eyes: Pupils are equal, round, and reactive to light. Conjunctivae are normal. Right eye exhibits no discharge. Left eye exhibits no discharge.  Neck: Neck supple.  Cardiovascular: Normal rate, regular rhythm, normal heart sounds and intact distal pulses.   Bilateral radial, posterior tibialis and dorsalis pedis pulses are intact.    Pulmonary/Chest: Effort normal and breath sounds normal. No respiratory distress. He has no wheezes. He has no rales.  Abdominal: Soft. There is no tenderness.  Musculoskeletal: He exhibits edema and tenderness.  Tenderness and edema noted to his right anterior knee with  large joint effusion. Overlying skin changes. Pain with range of motion of his right knee. Patient does have good range of motion of his knee, albeit with pain. No other extremity edema or tenderness noted.  Lymphadenopathy:    He has no cervical adenopathy.  Neurological: He is alert. No sensory deficit. Coordination normal.  Skin: Skin is warm and dry. Capillary refill takes less than 2 seconds. No rash noted. He is not diaphoretic. No erythema. No pallor.  Psychiatric: He has a normal mood and  affect. His behavior is normal.  Nursing note and vitals reviewed.    ED Treatments / Results  Labs (all labs ordered are listed, but only abnormal results are displayed) Labs Reviewed  CBC WITH DIFFERENTIAL/PLATELET - Abnormal; Notable for the following:       Result Value   RBC 4.13 (*)    Hemoglobin 12.8 (*)    HCT 38.3 (*)    All other components within normal limits  SYNOVIAL CELL COUNT + DIFF, W/ CRYSTALS - Abnormal; Notable for the following:    Color, Synovial YELLOW (*)    Appearance-Synovial CLOUDY (*)    WBC, Synovial 610 (*)    Neutrophil, Synovial 80 (*)    Monocyte-Macrophage-Synovial Fluid 15 (*)    All other components within normal limits  BODY FLUID CULTURE  BASIC METABOLIC PANEL    EKG  EKG Interpretation None       Radiology Dg Knee Complete 4 Views Right  Result Date: 03/23/2017 CLINICAL DATA:  Right knee pain and swelling.  No known injury EXAM: RIGHT KNEE - COMPLETE 4+ VIEW COMPARISON:  None. FINDINGS: No fracture or dislocation. No evidence of arthropathy or other focal bone abnormality. There is a moderate joint effusion. Soft tissues are unremarkable. IMPRESSION: Moderate joint effusion.  No osseous abnormality. Electronically Signed   By: Rubye Oaks M.D.   On: 03/23/2017 23:12    Procedures Procedures (including critical care time)  Medications Ordered in ED Medications  lidocaine-EPINEPHrine (XYLOCAINE W/EPI) 2 %-1:200000 (PF) injection 30 mL (30 mLs Intradermal Given 03/24/17 0311)     Initial Impression / Assessment and Plan / ED Course  I have reviewed the triage vital signs and the nursing notes.  Pertinent labs & imaging results that were available during my care of the patient were reviewed by me and considered in my medical decision making (see chart for details).     This is a 26 y.o. Male who presents to the emergency department complaining of right knee and swelling for the past 4 days. Patient is HIV positive and is not  on antiretroviral therapy. He has never been on treatment before. He has seen ID Dr. Daiva Eves in the past. He denies any injury or trauma to his knee. He just reports swelling and pain with range of motion at his right knee. No other complaints. No treatments prior to arrival. He denies fevers.  On exam the patient is afebrile and nontoxic-appearing. He is noted to have a temperature of 99.9 on arrival. He has a moderate effusion of his right knee with no overlying skin changes. He has pain with range of motion, but still has good range of motion. Exam does not seem to be consistent with a septic joint. X-ray shows a moderate joint effusion without osseous abnormality. CBC was obtained which is reassuring. Absolute neutrophil count is normal. Is less likely this patient is immunocompromised. Knee arthrocentesis was performed by my attending, Dr. Read Drivers. It was cloudy and straw colored.  Synovial cell count returns with no organisms seen with 610 white blood cells. This is likely due to arthritis rather than septic joint. Septic joint is less likely. Per Dr. Read DriversMolpus will start on naproxen. Follow up with orthopedic surgery. I also encouraged follow up with ID for HAART treatment discussion. Return precautions discussed. I advised the patient to follow-up with their primary care provider this week. I advised the patient to return to the emergency department with new or worsening symptoms or new concerns. The patient verbalized understanding and agreement with plan.    This patient was discussed with and evaluated with Dr. Read DriversMolpus who agrees with assessment and plan.   Final Clinical Impressions(s) / ED Diagnoses   Final diagnoses:  Monoarthritis of knee, right  HIV (human immunodeficiency virus infection) (HCC)    New Prescriptions New Prescriptions   NAPROXEN (NAPROSYN) 500 MG TABLET    Take 1 tablet (500 mg total) by mouth 2 (two) times daily with a meal.     Everlene FarrierDansie, Faatima Tench, PA-C 03/24/17  0512    Paula LibraMolpus, John, MD 03/24/17 [id number]

## 2017-03-24 NOTE — ED Provider Notes (Signed)
MSE was initiated and I personally evaluated the patient and placed orders (if any) at  12:02 AM on March 24, 2017.  Patient presenting with monoarticular arthritis of the right knee. Onset 4 days ago progressively worsening. Difficulty with range of motion due to pain and swelling. Denies fever at home. Temperature is 99.9 the emergency department. Denies nausea, vomiting.  X-ray with moderate effusion. Warmth and swelling on exam. Patient will require arthrocentesis.  The patient appears stable so that the remainder of the MSE may be completed by another provider.   Georgiana ShoreMitchell, Jessica B, PA-C 03/24/17 Pernell Dupre0008    Raeford RazorKohut, Stephen, MD 03/30/17 1218

## 2017-03-24 NOTE — ED Provider Notes (Addendum)
Medical screening examination/treatment/procedure(s) were conducted as a shared visit with non-physician practitioner(s) and myself.  I personally evaluated the patient during the encounter.  Mild to moderately tender effusion of right knee.  ARTHOCENTESIS Performed by: Hanley SeamenMOLPUS,Kaijah Abts L Consent: Verbal consent obtained. Risks and benefits: risks, benefits and alternatives were discussed Consent given by: patient Required items: required blood products, implants, devices, and special equipment available Patient identity confirmed: verbally with patient Time out: Immediately prior to procedure a "time out" was called to verify the correct patient, procedure, equipment, support staff and site/side marked as required. Indications: Pain and effusion  Joint: Right knee Local anesthesia used: 2% lidocaine with epi in skin overlying medial side of knee  Preparation: Patient was prepped and draped in the usual sterile fashion. Aspirate appearance: Straw-colored cloudy  Aspirate amount: 45 ml Patient tolerance: Patient tolerated the procedure well with no immediate complications.  Nursing notes and vitals signs, including pulse oximetry, reviewed.  Summary of this visit's results, reviewed by myself:  EKG:  EKG Interpretation  Date/Time:    Ventricular Rate:    PR Interval:    QRS Duration:   QT Interval:    QTC Calculation:   R Axis:     Text Interpretation:         Labs:  Results for orders placed or performed during the hospital encounter of 03/23/17 (from the past 24 hour(s))  Basic metabolic panel     Status: None   Collection Time: 03/24/17  2:34 AM  Result Value Ref Range   Sodium 137 135 - 145 mmol/L   Potassium 3.8 3.5 - 5.1 mmol/L   Chloride 101 101 - 111 mmol/L   CO2 27 22 - 32 mmol/L   Glucose, Bld 88 65 - 99 mg/dL   BUN 9 6 - 20 mg/dL   Creatinine, Ser 0.451.00 0.61 - 1.24 mg/dL   Calcium 9.4 8.9 - 40.910.3 mg/dL   GFR calc non Af Amer >60 >60 mL/min   GFR calc Af Amer  >60 >60 mL/min   Anion gap 9 5 - 15  CBC with Differential     Status: Abnormal   Collection Time: 03/24/17  2:34 AM  Result Value Ref Range   WBC 8.8 4.0 - 10.5 K/uL   RBC 4.13 (L) 4.22 - 5.81 MIL/uL   Hemoglobin 12.8 (L) 13.0 - 17.0 g/dL   HCT 81.138.3 (L) 91.439.0 - 78.252.0 %   MCV 92.7 78.0 - 100.0 fL   MCH 31.0 26.0 - 34.0 pg   MCHC 33.4 30.0 - 36.0 g/dL   RDW 95.614.0 21.311.5 - 08.615.5 %   Platelets 332 150 - 400 K/uL   Neutrophils Relative % 61 %   Neutro Abs 5.5 1.7 - 7.7 K/uL   Lymphocytes Relative 26 %   Lymphs Abs 2.3 0.7 - 4.0 K/uL   Monocytes Relative 12 %   Monocytes Absolute 1.0 0.1 - 1.0 K/uL   Eosinophils Relative 1 %   Eosinophils Absolute 0.1 0.0 - 0.7 K/uL   Basophils Relative 0 %   Basophils Absolute 0.0 0.0 - 0.1 K/uL  Body fluid culture     Status: None (Preliminary result)   Collection Time: 03/24/17  3:45 AM  Result Value Ref Range   Specimen Description KNEE RIGHT    Special Requests NONE    Gram Stain      NO ORGANISMS SEEN WBC PRESENT,BOTH PMN AND MONONUCLEAR CYTOSPIN Gram Stain Report Called to,Read Back By and Verified With: B JESSEE AT 0435 ON  07.23.2018 BY NBROOKS    Culture PENDING    Report Status PENDING   Synovial cell count + diff, w/ crystals     Status: Abnormal   Collection Time: 03/24/17  3:45 AM  Result Value Ref Range   Color, Synovial YELLOW (A) YELLOW   Appearance-Synovial CLOUDY (A) CLEAR   Crystals, Fluid NO CRYSTALS SEEN    WBC, Synovial 610 (H) 0 - 200 /cu mm   Neutrophil, Synovial 80 (H) 0 - 25 %   Lymphocytes-Synovial Fld 5 0 - 20 %   Monocyte-Macrophage-Synovial Fluid 15 (L) 50 - 90 %    Imaging Studies: Dg Knee Complete 4 Views Right  Result Date: 03/23/2017 CLINICAL DATA:  Right knee pain and swelling.  No known injury EXAM: RIGHT KNEE - COMPLETE 4+ VIEW COMPARISON:  None. FINDINGS: No fracture or dislocation. No evidence of arthropathy or other focal bone abnormality. There is a moderate joint effusion. Soft tissues are  unremarkable. IMPRESSION: Moderate joint effusion.  No osseous abnormality. Electronically Signed   By: Rubye Oaks M.D.   On: 03/23/2017 23:12        Dravin Lance, Jonny Ruiz, MD 03/24/17 0357    Paula Libra, MD 03/24/17 0500

## 2017-03-27 LAB — BODY FLUID CULTURE
CULTURE: NO GROWTH
GRAM STAIN: NONE SEEN

## 2017-04-03 LAB — RPR: RPR Ser Ql: NONREACTIVE

## 2018-05-19 IMAGING — CR DG KNEE COMPLETE 4+V*R*
4 series · 4 of 4 positions shown · non-contrast
Comparison: None.

CLINICAL DATA: Right knee pain and swelling.  No known injury

EXAM:
RIGHT KNEE - COMPLETE 4+ VIEW

[t knee ap right]
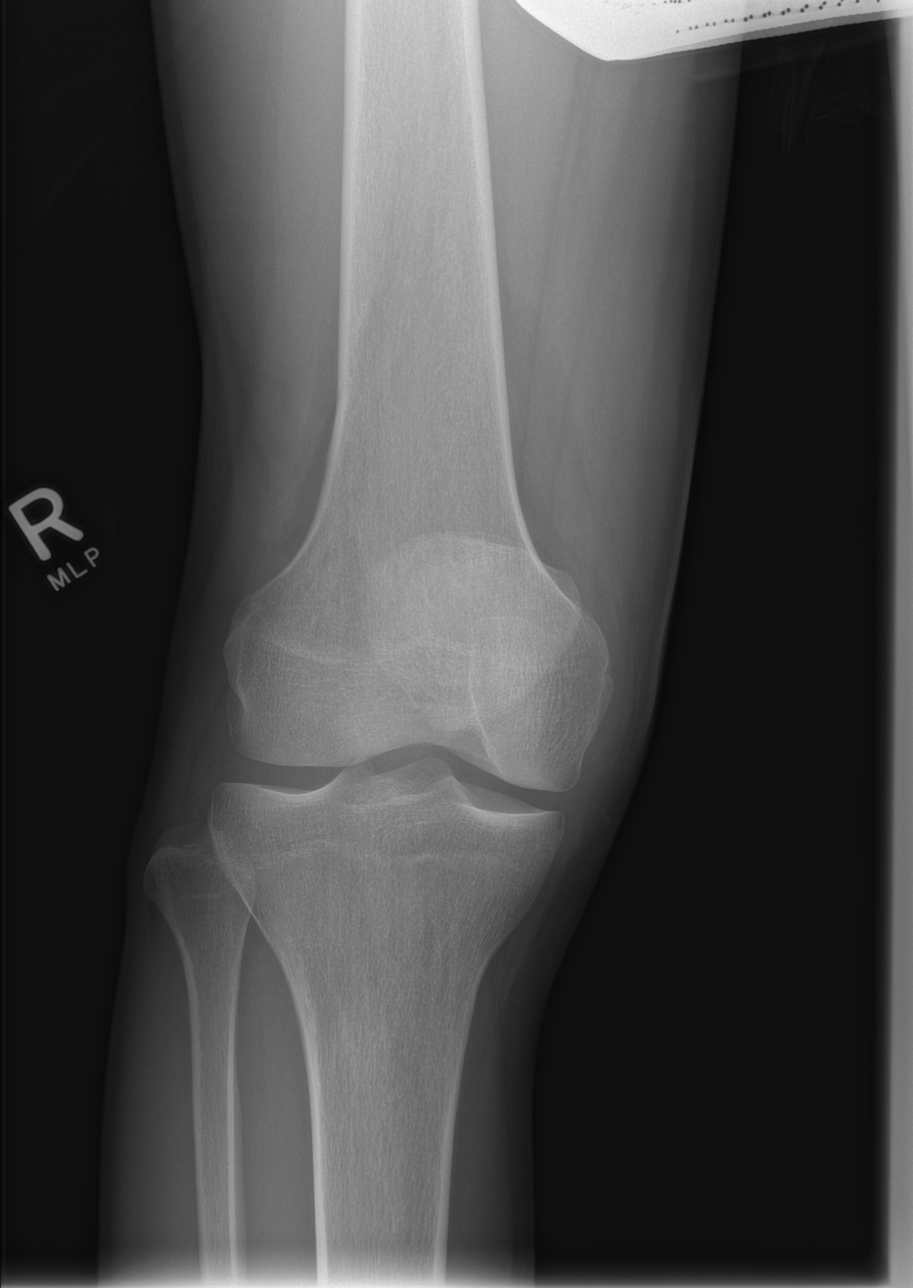

[t knee obl right (1 of 2)]
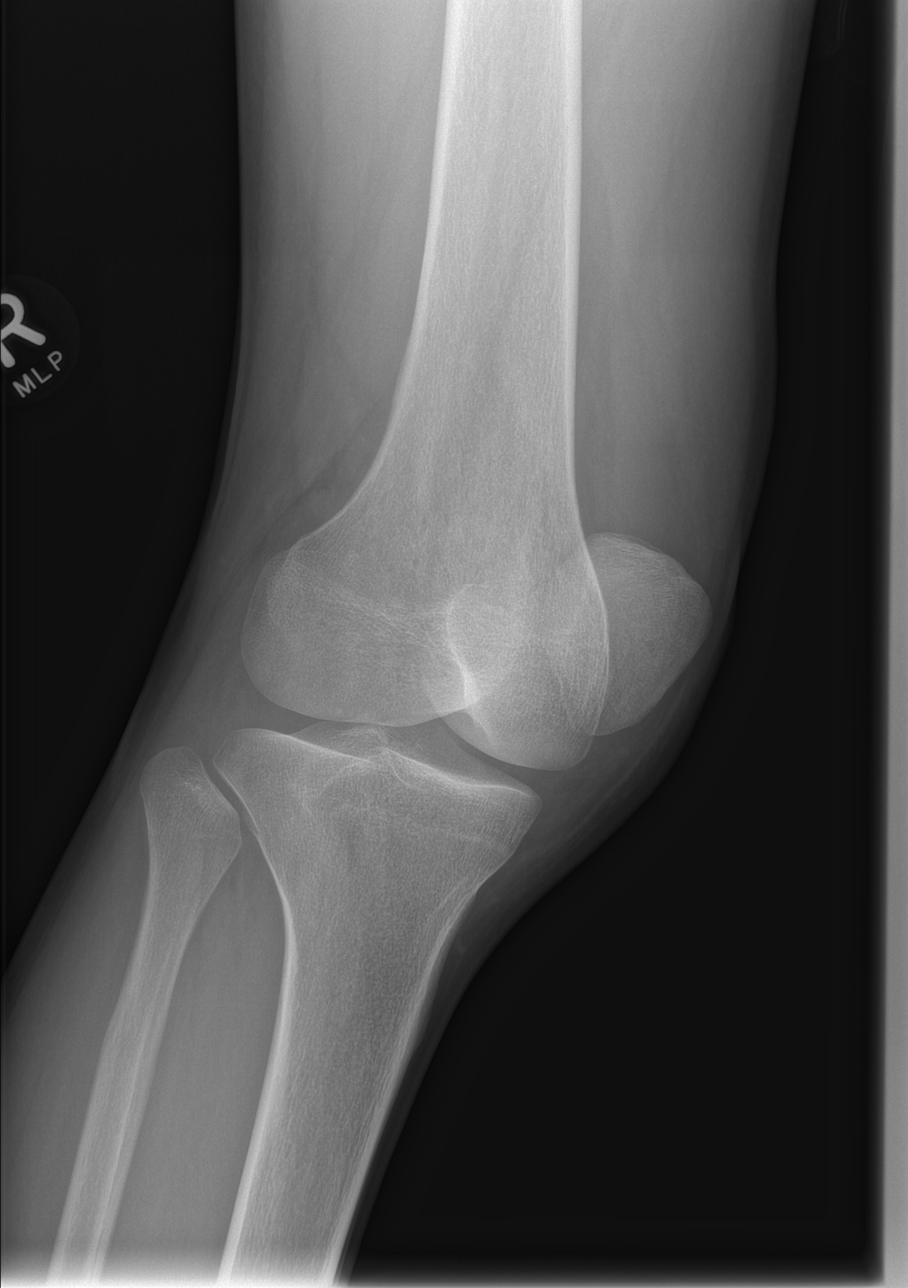

[t knee obl right (2 of 2)]
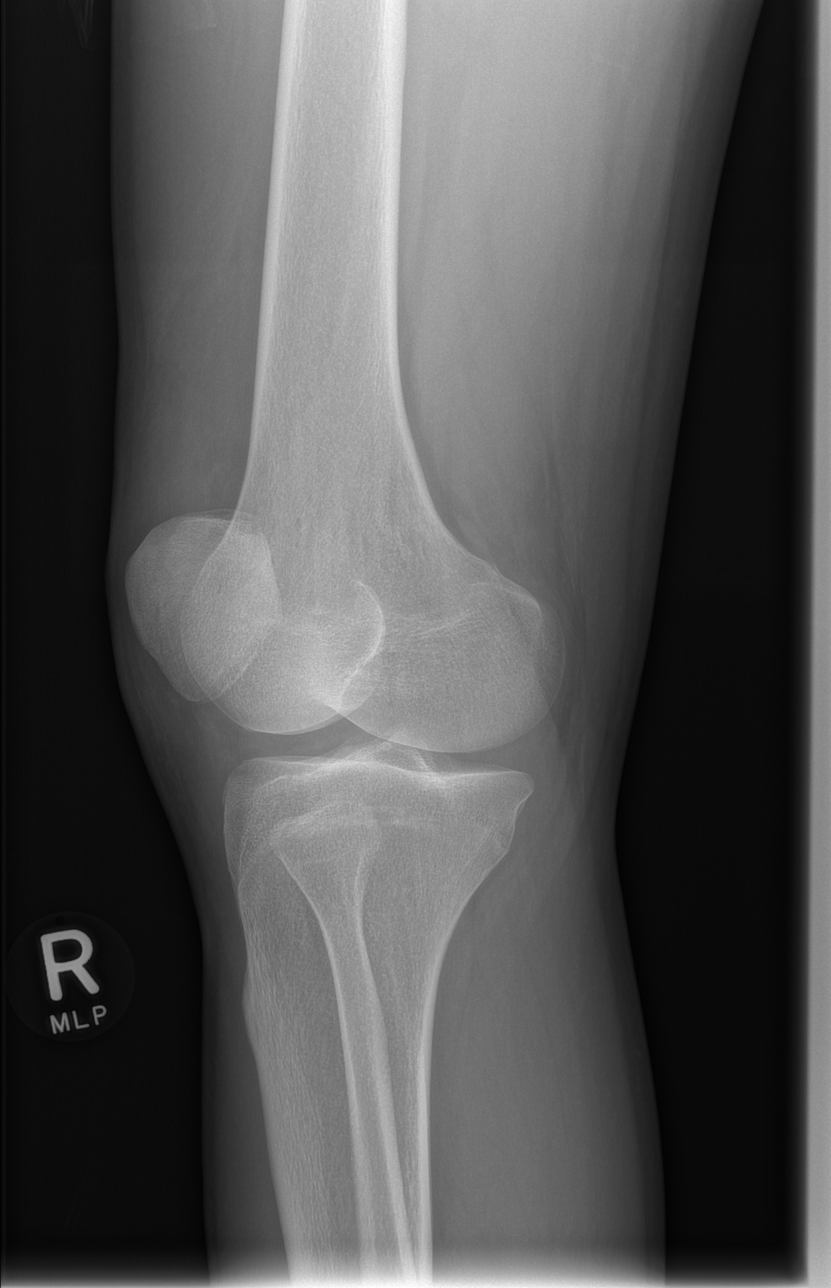

[t knee lat right]
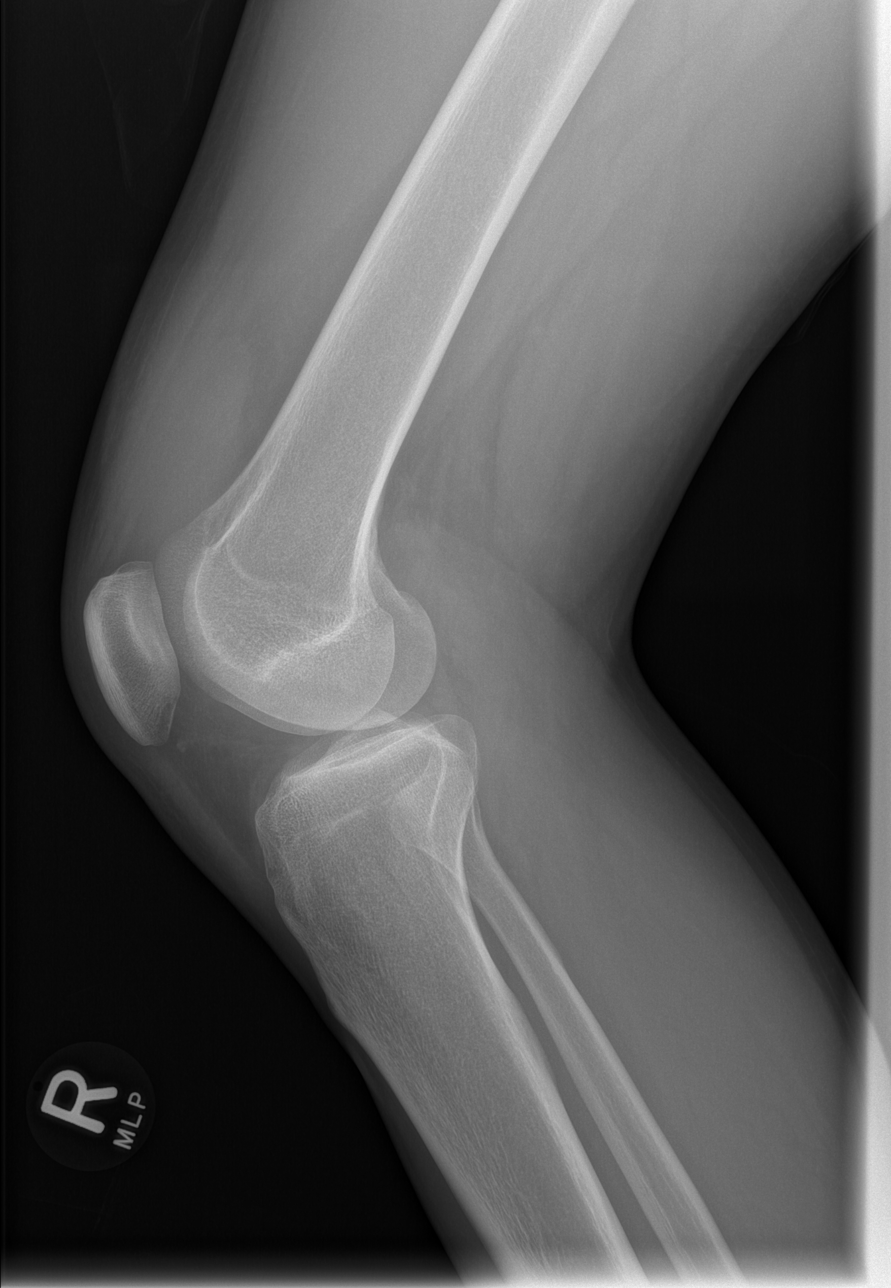

[4 of 4 positions shown; findings below may reference images not displayed]

FINDINGS: No fracture or dislocation. No evidence of arthropathy or other
focal bone abnormality. There is a moderate joint effusion. Soft
tissues are unremarkable.
IMPRESSION: Moderate joint effusion.  No osseous abnormality.
